# Patient Record
Sex: Female | Born: 1960 | Race: White | Hispanic: No | Marital: Married | Smoking: Never smoker
Health system: Southern US, Community
[De-identification: ages and names within clinical notes are randomized; demographics above are authoritative.]

## PROBLEM LIST (undated history)

## (undated) HISTORY — PX: BREAST BIOPSY: SHX20

---

## 1998-03-31 ENCOUNTER — Other Ambulatory Visit: Admission: RE | Admit: 1998-03-31 | Discharge: 1998-03-31 | Payer: Self-pay | Admitting: Obstetrics & Gynecology

## 1999-07-09 ENCOUNTER — Other Ambulatory Visit: Admission: RE | Admit: 1999-07-09 | Discharge: 1999-07-09 | Payer: Self-pay | Admitting: Obstetrics & Gynecology

## 2002-10-31 ENCOUNTER — Other Ambulatory Visit: Admission: RE | Admit: 2002-10-31 | Discharge: 2002-10-31 | Payer: Self-pay | Admitting: Obstetrics and Gynecology

## 2011-09-21 ENCOUNTER — Other Ambulatory Visit: Payer: Self-pay | Admitting: Obstetrics & Gynecology

## 2011-09-21 DIAGNOSIS — R928 Other abnormal and inconclusive findings on diagnostic imaging of breast: Secondary | ICD-10-CM

## 2011-10-06 ENCOUNTER — Ambulatory Visit
Admission: RE | Admit: 2011-10-06 | Discharge: 2011-10-06 | Disposition: A | Discharge status: Home / Self Care | Payer: PRIVATE HEALTH INSURANCE | Source: Ambulatory Visit | Attending: Obstetrics & Gynecology | Admitting: Obstetrics & Gynecology

## 2011-10-06 ENCOUNTER — Other Ambulatory Visit: Payer: Self-pay | Admitting: Obstetrics & Gynecology

## 2011-10-06 DIAGNOSIS — R928 Other abnormal and inconclusive findings on diagnostic imaging of breast: Secondary | ICD-10-CM

## 2011-10-15 ENCOUNTER — Other Ambulatory Visit: Payer: PRIVATE HEALTH INSURANCE

## 2011-10-19 ENCOUNTER — Other Ambulatory Visit: Payer: Self-pay | Admitting: Obstetrics & Gynecology

## 2011-10-19 ENCOUNTER — Ambulatory Visit
Admission: RE | Admit: 2011-10-19 | Discharge: 2011-10-19 | Disposition: A | Discharge status: Home / Self Care | Payer: PRIVATE HEALTH INSURANCE | Source: Ambulatory Visit | Attending: Obstetrics & Gynecology | Admitting: Obstetrics & Gynecology

## 2011-10-19 DIAGNOSIS — R928 Other abnormal and inconclusive findings on diagnostic imaging of breast: Secondary | ICD-10-CM

## 2013-01-02 ENCOUNTER — Other Ambulatory Visit: Payer: Self-pay | Admitting: Dermatology

## 2014-08-21 ENCOUNTER — Other Ambulatory Visit: Payer: Self-pay | Admitting: Dermatology

## 2015-02-04 ENCOUNTER — Other Ambulatory Visit: Payer: Self-pay | Admitting: Obstetrics & Gynecology

## 2015-02-05 LAB — CYTOLOGY - PAP

## 2017-07-21 ENCOUNTER — Ambulatory Visit (INDEPENDENT_AMBULATORY_CARE_PROVIDER_SITE_OTHER): Payer: PRIVATE HEALTH INSURANCE

## 2017-07-21 ENCOUNTER — Ambulatory Visit (INDEPENDENT_AMBULATORY_CARE_PROVIDER_SITE_OTHER): Payer: PRIVATE HEALTH INSURANCE | Admitting: Podiatry

## 2017-07-21 ENCOUNTER — Encounter: Payer: Self-pay | Admitting: Podiatry

## 2017-07-21 VITALS — BP 158/83 | HR 84 | Resp 16

## 2017-07-21 DIAGNOSIS — M7662 Achilles tendinitis, left leg: Secondary | ICD-10-CM | POA: Diagnosis not present

## 2017-07-21 DIAGNOSIS — M779 Enthesopathy, unspecified: Secondary | ICD-10-CM

## 2017-07-21 DIAGNOSIS — M7750 Other enthesopathy of unspecified foot: Secondary | ICD-10-CM | POA: Diagnosis not present

## 2017-07-21 MED ORDER — DICLOFENAC SODIUM 75 MG PO TBEC: 75.0000 mg | DELAYED_RELEASE_TABLET | Freq: Two times a day (BID) | ORAL | 2 refills | Status: DC

## 2017-07-21 MED ORDER — TRIAMCINOLONE ACETONIDE 10 MG/ML IJ SUSP
10.0000 mg | Freq: Once | INTRAMUSCULAR | Status: AC
  Administered 2017-07-21: 10 mg

## 2017-07-21 NOTE — Progress Notes (Signed)
   Subjective:    Patient ID: Linzie CollinJanet A Goldring, female    DOB: 07/22/1961, 56 y.o.   MRN: [id number]  HPI Chief Complaint  Patient presents with  . Foot Pain    Left foot; back of heel; pt stated, "Hurts more in the morning"; x3-6 month   Went to [facility] 5-6 years ago   Review of Systems  HENT: Positive for hearing loss and tinnitus.   All other systems reviewed and are negative.      Objective:   Physical Exam        Assessment & Plan:

## 2017-07-21 NOTE — Progress Notes (Signed)
Subjective:    Patient ID: Regina Palmer, female   DOB: 56 y.o.   MRN: [id number]   HPI patient points to the back of the left heel and states it's been hurting her for about 6 months and she does not remember specific injury    Review of Systems  All other systems reviewed and are negative.       Objective:  Physical Exam  Constitutional: She appears well-developed and well-nourished.  Cardiovascular: Intact distal pulses.   Pulmonary/Chest: Effort normal.  Musculoskeletal: Normal range of motion.  Neurological: She is alert.  Skin: Skin is warm.  Nursing note and vitals reviewed.  neurovascular status intact muscle strength adequate range of motion within normal limits with patient noted to have inflammation and pain at the Achilles tendon insertion left lateral side with fluid buildup. The central and medial portion the tendon appear to be functioning well with no indications of inflammatory condition and patient did have good strength of the Achilles with mild equinus     Assessment:    Acute Achilles tendinitis left at the insertion lateral side     Plan:    H&P x-rays reviewed condition discussed. At this time I injected the left Achilles tendon at the insertion lateral side 3 mg dexamethasone Kenalog 5 mill grams Xylocaine after first reviewing with her the possibility for tear of the Achilles. She understood this and wanted procedure which was done and I did apply a air fracture walker to completely immobilize the posterior heel and placed on diclofenac 75 mg twice a day  X-rays indicate there is large spur formation posterior heel with no indication of stress fracture or other problem

## 2017-07-21 NOTE — Patient Instructions (Signed)

## 2017-08-11 ENCOUNTER — Encounter: Payer: Self-pay | Admitting: Podiatry

## 2017-08-11 ENCOUNTER — Ambulatory Visit (INDEPENDENT_AMBULATORY_CARE_PROVIDER_SITE_OTHER): Payer: PRIVATE HEALTH INSURANCE | Admitting: Podiatry

## 2017-08-11 DIAGNOSIS — M7662 Achilles tendinitis, left leg: Secondary | ICD-10-CM

## 2017-08-11 MED ORDER — CELECOXIB 100 MG PO CAPS: 100.0000 mg | ORAL_CAPSULE | Freq: Two times a day (BID) | ORAL | 1 refills | Status: DC

## 2017-08-11 MED ORDER — CELECOXIB 200 MG PO CAPS: 200.0000 mg | ORAL_CAPSULE | Freq: Two times a day (BID) | ORAL | 1 refills | Status: DC

## 2017-08-11 NOTE — Progress Notes (Signed)
Subjective:    Patient ID: Regina Palmer, female   DOB: 56 y.o.   MRN: [id number]   HPI patient states that she's feeling quite a bit better with her Achilles but still getting occasional pain and a little bit of pain also on the inside of    ROS      Objective:  Physical Exam neurovascular status intact with patient noted to have inflammation of a mild nature of the posterior medial lateral heel but significant improvement from previous visit     Assessment:   Achilles tendinitis improving with immobilization injection      Plan:   Advised on the importance of physical therapy and we placed on Celebrex 100 mg twice a day and I dispensed a silicone sleeve to try to provide cushion to the back of the heel. Reappoint as needed

## 2020-07-22 ENCOUNTER — Ambulatory Visit
Admission: EM | Admit: 2020-07-22 | Discharge: 2020-07-22 | Disposition: A | Discharge status: Home / Self Care | Payer: PRIVATE HEALTH INSURANCE

## 2021-01-20 ENCOUNTER — Other Ambulatory Visit: Payer: Self-pay | Admitting: Family Medicine

## 2021-01-20 DIAGNOSIS — Z1231 Encounter for screening mammogram for malignant neoplasm of breast: Secondary | ICD-10-CM

## 2021-01-28 ENCOUNTER — Ambulatory Visit
Admission: RE | Admit: 2021-01-28 | Discharge: 2021-01-28 | Disposition: A | Discharge status: Home / Self Care | Payer: PRIVATE HEALTH INSURANCE | Source: Ambulatory Visit | Attending: Family Medicine | Admitting: Family Medicine

## 2021-01-28 ENCOUNTER — Other Ambulatory Visit: Payer: Self-pay

## 2021-01-28 DIAGNOSIS — Z1231 Encounter for screening mammogram for malignant neoplasm of breast: Secondary | ICD-10-CM

## 2021-12-11 NOTE — Progress Notes (Incomplete)
[facility] - Clinic Note  12/14/2021     CHIEF COMPLAINT Patient presents for No chief complaint on file.   HISTORY OF PRESENT ILLNESS: Regina Palmer is a 61 y.o. female who presents to the clinic today for:     Referring physician: Deatra James, MD [phone] [address] [place],  [place] [address]  HISTORICAL INFORMATION:   Selected notes from the MEDICAL RECORD NUMBER Referred by Dr. Nedra Hai:  Ocular Hx- PMH-    CURRENT MEDICATIONS: No current outpatient medications on file. (Ophthalmic Drugs)   No current facility-administered medications for this visit. (Ophthalmic Drugs)   Current Outpatient Medications (Other)  Medication Sig   celecoxib (CELEBREX) 100 MG capsule Take 1 capsule (100 mg total) by mouth 2 (two) times daily.   celecoxib (CELEBREX) 200 MG capsule Take 1 capsule (200 mg total) by mouth 2 (two) times daily.   VYVANSE 40 MG capsule    No current facility-administered medications for this visit. (Other)      REVIEW OF SYSTEMS:    ALLERGIES No Known Allergies  PAST MEDICAL HISTORY No past medical history on file. Past Surgical History:  Procedure Laterality Date   BREAST BIOPSY Left     FAMILY HISTORY No family history on file.  SOCIAL HISTORY Social History   Tobacco Use   Smoking status: Never   Smokeless tobacco: Never         OPHTHALMIC EXAM:  Not recorded     IMAGING AND PROCEDURES  Imaging and Procedures for 12/14/2021           ASSESSMENT/PLAN:  No diagnosis found.  1.  2.  3.  Ophthalmic Meds Ordered this visit:  No orders of the defined types were placed in this encounter.      No follow-ups on file.  There are no Patient Instructions on file for this visit.   Explained the diagnoses, plan, and follow up with the patient and they expressed understanding.  Patient expressed understanding of the importance of proper follow up care.   This document serves as a record of  services personally performed by Karie Chimera, MD, PhD. It was created on their behalf by Annalee Genta, COMT. The creation of this record is the provider's dictation and/or activities during the visit.  Electronically signed by: Annalee Genta, COMT 12/11/21 2:06 PM    Karie Chimera, M.D., Ph.D. Diseases & Surgery of the Retina and Vitreous [facility] @TODAY @     Abbreviations: M myopia (nearsighted); A astigmatism; H hyperopia (farsighted); P presbyopia; Mrx spectacle prescription;  CTL contact lenses; OD right eye; OS left eye; OU both eyes  XT exotropia; ET esotropia; PEK punctate epithelial keratitis; PEE punctate epithelial erosions; DES dry eye syndrome; MGD meibomian gland dysfunction; ATs artificial tears; PFAT's preservative free artificial tears; NSC nuclear sclerotic cataract; PSC posterior subcapsular cataract; ERM epi-retinal membrane; PVD posterior vitreous detachment; RD retinal detachment; DM diabetes mellitus; DR diabetic retinopathy; NPDR non-proliferative diabetic retinopathy; PDR proliferative diabetic retinopathy; CSME clinically significant macular edema; DME diabetic macular edema; dbh dot blot hemorrhages; CWS cotton wool spot; POAG primary open angle glaucoma; C/D cup-to-disc ratio; HVF humphrey visual field; GVF goldmann visual field; OCT optical coherence tomography; IOP intraocular pressure; BRVO Branch retinal vein occlusion; CRVO central retinal vein occlusion; CRAO central retinal artery occlusion; BRAO branch retinal artery occlusion; RT retinal tear; SB scleral buckle; PPV pars plana vitrectomy; VH Vitreous hemorrhage; PRP panretinal laser photocoagulation; IVK intravitreal  kenalog; VMT vitreomacular traction; MH Macular hole;  NVD neovascularization of the disc; NVE neovascularization elsewhere; AREDS age related eye disease study; ARMD age related macular degeneration; POAG primary open angle glaucoma; EBMD epithelial/anterior basement  membrane dystrophy; ACIOL anterior chamber intraocular lens; IOL intraocular lens; PCIOL posterior chamber intraocular lens; Phaco/IOL phacoemulsification with intraocular lens placement; PRK photorefractive keratectomy; LASIK laser assisted in situ keratomileusis; HTN hypertension; DM diabetes mellitus; COPD chronic obstructive pulmonary disease

## 2021-12-14 ENCOUNTER — Encounter (INDEPENDENT_AMBULATORY_CARE_PROVIDER_SITE_OTHER): Payer: PRIVATE HEALTH INSURANCE | Admitting: Ophthalmology

## 2022-01-25 IMAGING — MG MM DIGITAL SCREENING BILAT W/ TOMO AND CAD
8 series · 8 of 24 positions shown · non-contrast
Comparison: Previous exam(s).

ACR Breast Density Category a: The breast tissue is almost entirely
fatty.

CLINICAL DATA: Screening.

EXAM:
DIGITAL SCREENING BILATERAL MAMMOGRAM WITH TOMOSYNTHESIS AND CAD
TECHNIQUE: Bilateral screening digital craniocaudal and mediolateral oblique
mammograms were obtained. Bilateral screening digital breast
tomosynthesis was performed. The images were evaluated with
computer-aided detection.

[L MLO synth-2D]
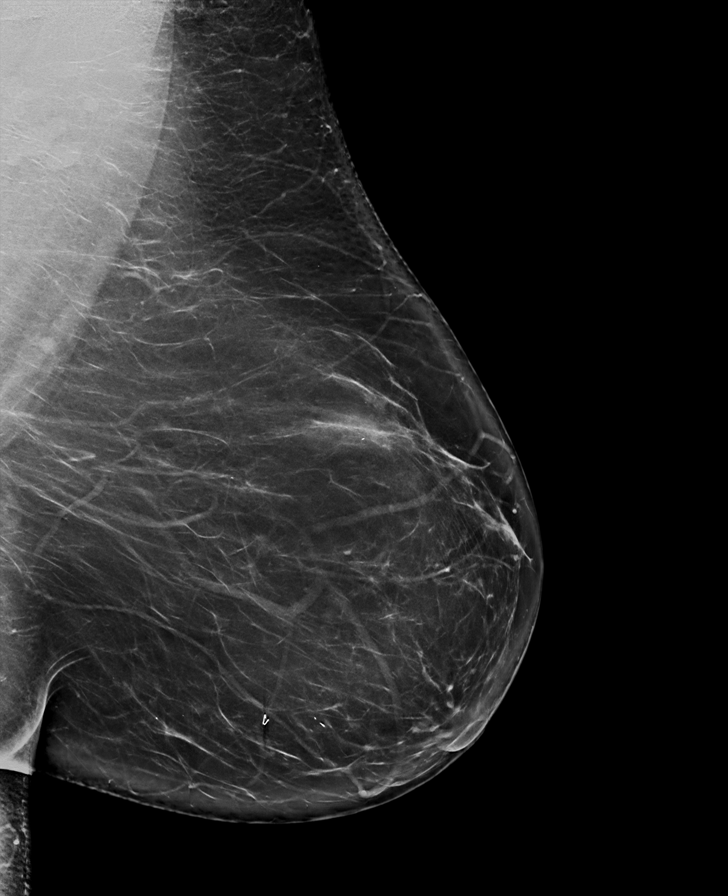

[R MLO synth-2D]
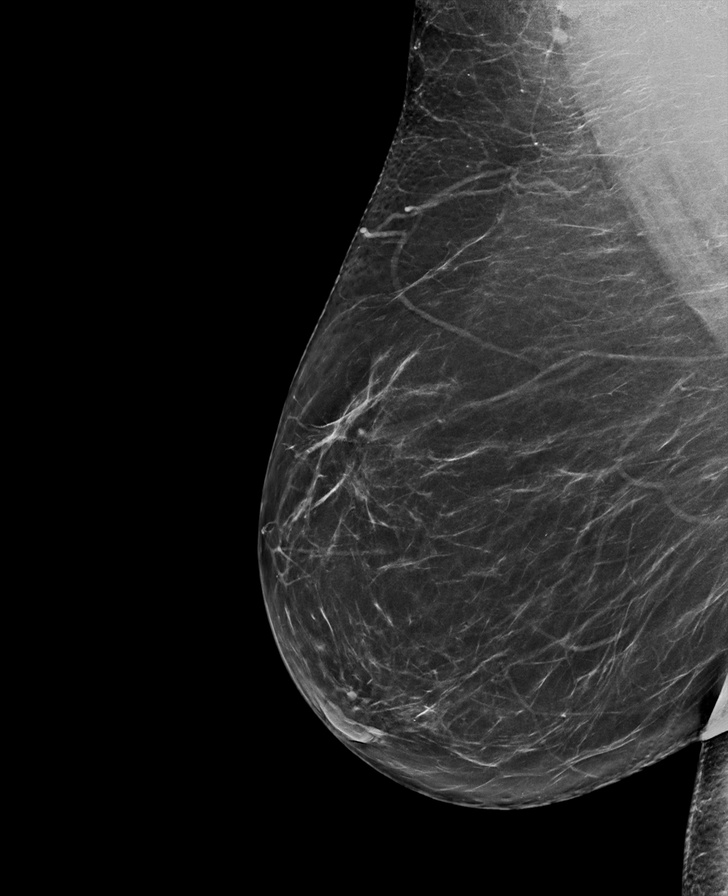

[L CC synth-2D]
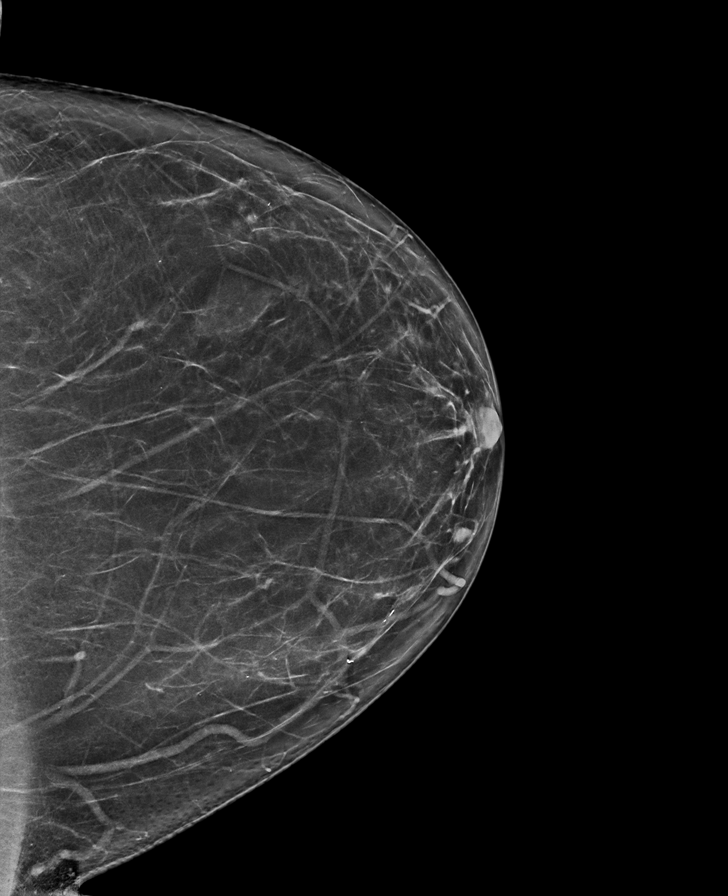

[R CC synth-2D]
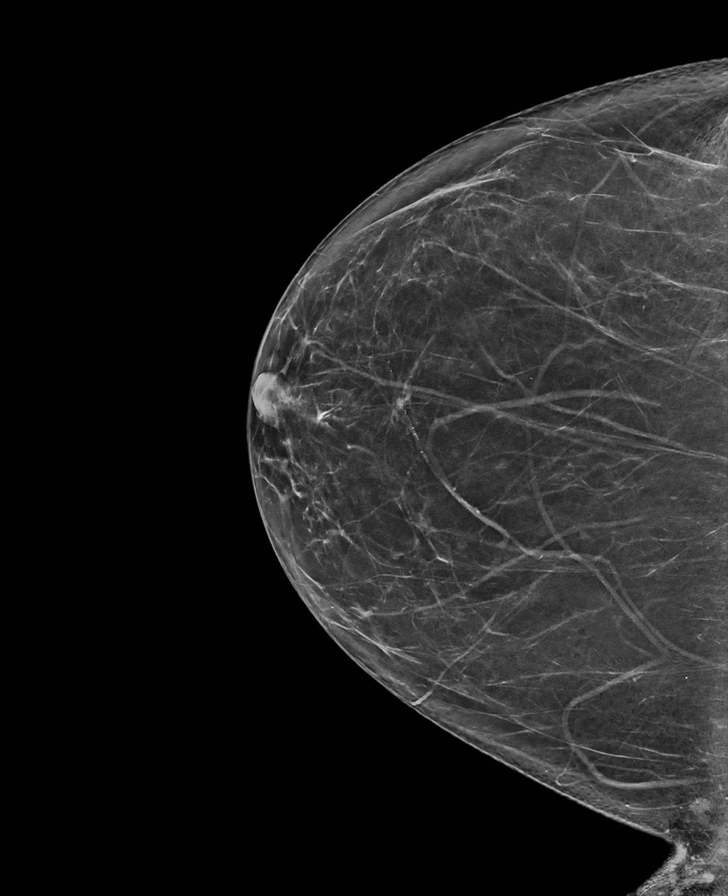

[R MLO tomo · tomo slice 42/83.0]
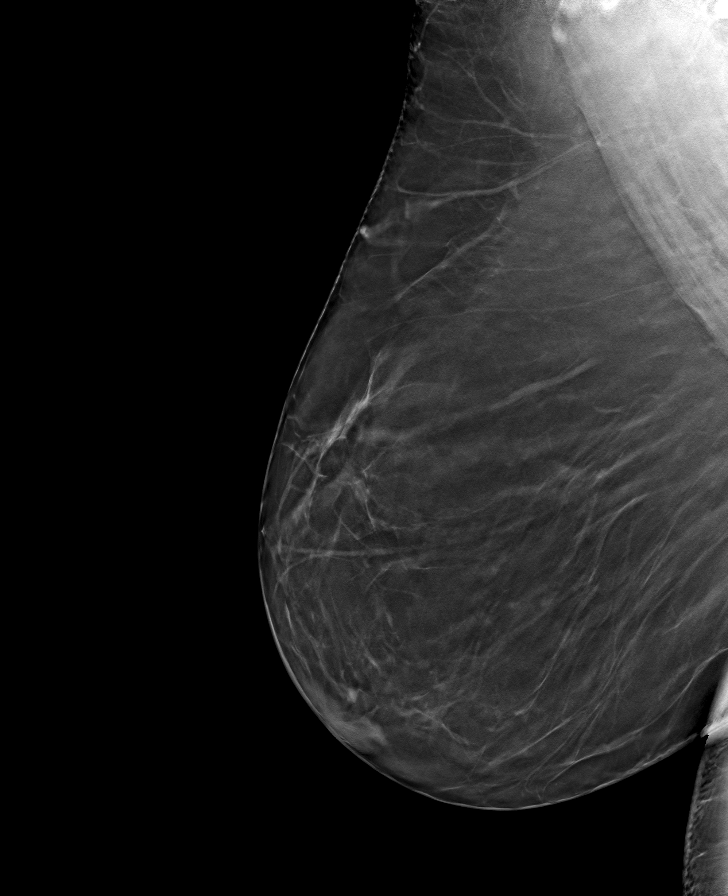

[L MLO tomo · tomo slice 48/95.0]
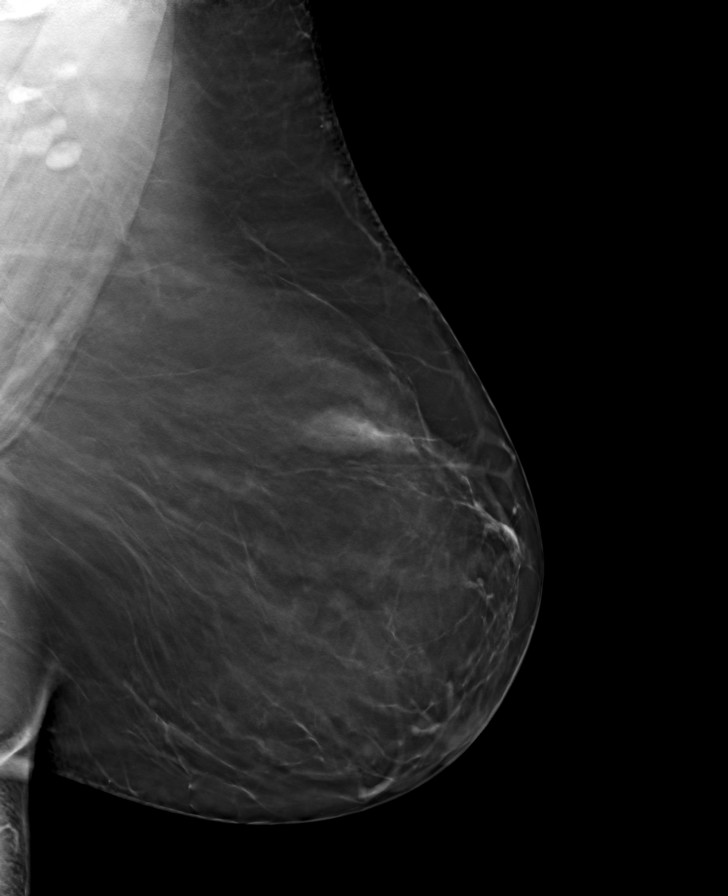

[L CC tomo · tomo slice 41/80.0]
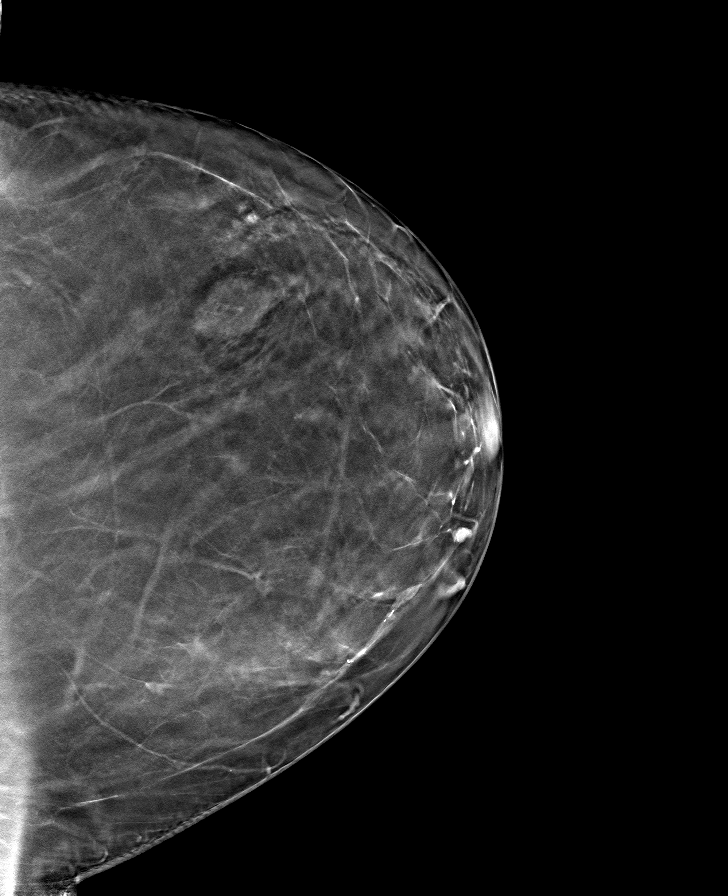

[R CC tomo · tomo slice 38/75.0]
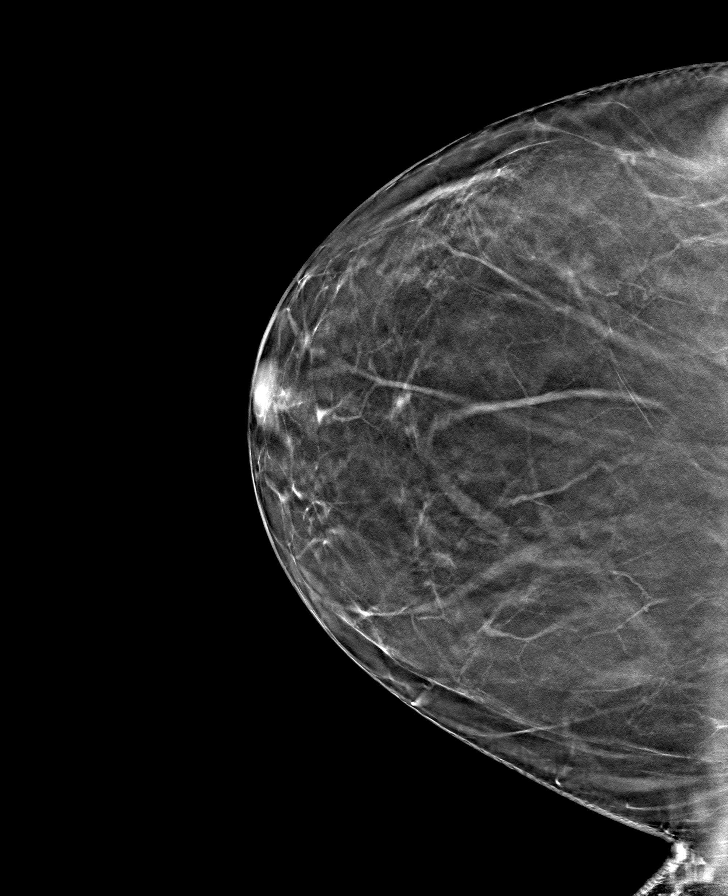

[8 of 24 positions shown; findings below may reference images not displayed]

FINDINGS: There are no findings suspicious for malignancy. The images were
evaluated with computer-aided detection.
IMPRESSION: No mammographic evidence of malignancy. A result letter of this
screening mammogram will be mailed directly to the patient.

RECOMMENDATION:
Screening mammogram in one year. (Code:JP-J-DD5)

BI-RADS CATEGORY  1: Negative.

## 2023-12-05 ENCOUNTER — Encounter: Payer: Self-pay | Admitting: Podiatry

## 2023-12-05 ENCOUNTER — Ambulatory Visit (INDEPENDENT_AMBULATORY_CARE_PROVIDER_SITE_OTHER): Payer: No Typology Code available for payment source | Admitting: Podiatry

## 2023-12-05 ENCOUNTER — Ambulatory Visit (INDEPENDENT_AMBULATORY_CARE_PROVIDER_SITE_OTHER): Payer: No Typology Code available for payment source

## 2023-12-05 DIAGNOSIS — M722 Plantar fascial fibromatosis: Secondary | ICD-10-CM | POA: Diagnosis not present

## 2023-12-05 MED ORDER — MELOXICAM 15 MG PO TABS: 15.0000 mg | ORAL_TABLET | Freq: Every day | ORAL | 0 refills | Status: DC

## 2023-12-06 NOTE — Progress Notes (Signed)
   Chief Complaint  Patient presents with   Foot Pain    Plantar heel/some posterior heel left - pins and needle like sensation, radiating pain up lateral side achilles, has had the flu so has been off feet for awhile so today feeling some better   Callouses    Medial hallux right/heels bilateral - wanted to know if you could trim the calluses   New Patient (Initial Visit)    Est pt 2018    HPI: 63 y.o. female presenting today as a new patient for evaluation of generalized foot pain bilateral.  She does admit to walking around the house significantly throughout the day barefoot.  She has also been experiencing left heel pain.  Idiopathic gradual onset.  Finally the patient states that she has been developing symptomatic calluses to the bilateral great toes.  Requesting they be evaluated today  No past medical history on file.  Past Surgical History:  Procedure Laterality Date   BREAST BIOPSY Left     No Known Allergies   Physical Exam: General: The patient is alert and oriented x3 in no acute distress.  Dermatology: Skin is warm, dry and supple bilateral lower extremities.  Hyperkeratotic callus formation noted bilateral  Vascular: Palpable pedal pulses bilaterally. Capillary refill within normal limits.  No appreciable edema.  No erythema.  Neurological: Grossly intact via light touch  Musculoskeletal Exam: No pedal deformities noted.  There is tenderness with palpation to the posterior heel and generalized throughout the foot to lateral  Radiographic Exam LT foot 12/05/2023:  Normal osseous mineralization. Joint spaces preserved.  No fractures or osseous irregularities noted.  Large posterior ossicle noted just proximal to the posterior tubercle of the calcaneus likely embedded within the deep fibers of the Achilles tendon.  Plantar heel spur also noticed  Assessment/Plan of Care: 1.  Achilles tendinitis left 2.  Symptomatic calluses bilateral 3.  Generalized foot pain  bilateral  -Patient evaluated.  X-rays reviewed -Patient does admit to walking around the house significantly throughout the day barefoot.  I do believe that this is contributing the patient's pain and generalized foot achiness.  Recommend that she wears good supportive shoes and sneakers even around the house -Also explained to the patient that the callus formations is also likely secondary to increased friction and shear when walking barefoot.  This should also improve with shoes -Excisional debridement of the callus lesions was performed today using 312 scalpel without incident or bleeding -Prescription for meloxicam 15 mg daily -Return to clinic as needed       Felecia Shelling, DPM [facility]  Dr. Felecia Shelling, DPM    [address] [place], [place] [address]                Office [phone]  Fax [phone]

## 2024-01-01 ENCOUNTER — Other Ambulatory Visit: Payer: Self-pay | Admitting: Podiatry
